# Patient Record
Sex: Female | Born: 1990 | Hispanic: No | Smoking: Never smoker
Health system: Southern US, Community
[De-identification: ages and names within clinical notes are randomized; demographics above are authoritative.]

## PROBLEM LIST (undated history)

## (undated) DIAGNOSIS — F909 Attention-deficit hyperactivity disorder, unspecified type: Secondary | ICD-10-CM

## (undated) DIAGNOSIS — F419 Anxiety disorder, unspecified: Secondary | ICD-10-CM

## (undated) DIAGNOSIS — G4700 Insomnia, unspecified: Secondary | ICD-10-CM

## (undated) DIAGNOSIS — J45909 Unspecified asthma, uncomplicated: Secondary | ICD-10-CM

## (undated) HISTORY — DX: Attention-deficit hyperactivity disorder, unspecified type: F90.9

## (undated) HISTORY — PX: DENTAL SURGERY: SHX609

## (undated) HISTORY — DX: Anxiety disorder, unspecified: F41.9

## (undated) HISTORY — DX: Insomnia, unspecified: G47.00

---

## 2018-10-15 ENCOUNTER — Emergency Department (HOSPITAL_COMMUNITY): Payer: Medicaid Other

## 2018-10-15 ENCOUNTER — Other Ambulatory Visit: Payer: Self-pay

## 2018-10-15 ENCOUNTER — Emergency Department (HOSPITAL_COMMUNITY)
Admission: EM | Admit: 2018-10-15 | Discharge: 2018-10-15 | Disposition: A | Discharge status: Home / Self Care | Payer: Medicaid Other | Attending: Emergency Medicine | Admitting: Emergency Medicine

## 2018-10-15 ENCOUNTER — Encounter (HOSPITAL_COMMUNITY): Payer: Self-pay

## 2018-10-15 DIAGNOSIS — R000 Tachycardia, unspecified: Secondary | ICD-10-CM | POA: Insufficient documentation

## 2018-10-15 DIAGNOSIS — Z209 Contact with and (suspected) exposure to unspecified communicable disease: Secondary | ICD-10-CM | POA: Diagnosis not present

## 2018-10-15 DIAGNOSIS — Z79899 Other long term (current) drug therapy: Secondary | ICD-10-CM | POA: Diagnosis not present

## 2018-10-15 DIAGNOSIS — E860 Dehydration: Secondary | ICD-10-CM | POA: Insufficient documentation

## 2018-10-15 DIAGNOSIS — J069 Acute upper respiratory infection, unspecified: Secondary | ICD-10-CM | POA: Diagnosis not present

## 2018-10-15 DIAGNOSIS — R509 Fever, unspecified: Secondary | ICD-10-CM

## 2018-10-15 LAB — CBC WITH DIFFERENTIAL/PLATELET
ABS IMMATURE GRANULOCYTES: 0.03 10*3/uL (ref 0.00–0.07)
BASOS PCT: 0 %
Basophils Absolute: 0 10*3/uL (ref 0.0–0.1)
Eosinophils Absolute: 0 10*3/uL (ref 0.0–0.5)
Eosinophils Relative: 1 %
HCT: 37.7 % (ref 36.0–46.0)
Hemoglobin: 12.5 g/dL (ref 12.0–15.0)
Immature Granulocytes: 0 %
Lymphocytes Relative: 13 %
Lymphs Abs: 1 10*3/uL (ref 0.7–4.0)
MCH: 29.4 pg (ref 26.0–34.0)
MCHC: 33.2 g/dL (ref 30.0–36.0)
MCV: 88.7 fL (ref 80.0–100.0)
MONO ABS: 0.8 10*3/uL (ref 0.1–1.0)
Monocytes Relative: 10 %
NEUTROS ABS: 6 10*3/uL (ref 1.7–7.7)
NEUTROS PCT: 76 %
PLATELETS: 223 10*3/uL (ref 150–400)
RBC: 4.25 MIL/uL (ref 3.87–5.11)
RDW: 12 % (ref 11.5–15.5)
WBC: 7.9 10*3/uL (ref 4.0–10.5)
nRBC: 0 % (ref 0.0–0.2)

## 2018-10-15 LAB — INFLUENZA PANEL BY PCR (TYPE A & B)
INFLAPCR: NEGATIVE
INFLBPCR: NEGATIVE

## 2018-10-15 LAB — COMPREHENSIVE METABOLIC PANEL
ALT: 14 U/L (ref 0–44)
ANION GAP: 11 (ref 5–15)
AST: 18 U/L (ref 15–41)
Albumin: 4 g/dL (ref 3.5–5.0)
Alkaline Phosphatase: 49 U/L (ref 38–126)
BILIRUBIN TOTAL: 0.9 mg/dL (ref 0.3–1.2)
BUN: 6 mg/dL (ref 6–20)
CO2: 25 mmol/L (ref 22–32)
Calcium: 8.6 mg/dL — ABNORMAL LOW (ref 8.9–10.3)
Chloride: 104 mmol/L (ref 98–111)
Creatinine, Ser: 0.71 mg/dL (ref 0.44–1.00)
Glucose, Bld: 106 mg/dL — ABNORMAL HIGH (ref 70–99)
POTASSIUM: 3.4 mmol/L — AB (ref 3.5–5.1)
Sodium: 140 mmol/L (ref 135–145)
TOTAL PROTEIN: 7.3 g/dL (ref 6.5–8.1)

## 2018-10-15 LAB — I-STAT BETA HCG BLOOD, ED (MC, WL, AP ONLY): I-stat hCG, quantitative: <5 m[IU]/mL (ref <5)

## 2018-10-15 LAB — URINALYSIS, ROUTINE W REFLEX MICROSCOPIC
Bilirubin Urine: NEGATIVE
GLUCOSE, UA: NEGATIVE mg/dL
HGB URINE DIPSTICK: NEGATIVE
Ketones, ur: NEGATIVE mg/dL
Leukocytes, UA: NEGATIVE
Nitrite: NEGATIVE
PH: 6 (ref 5.0–8.0)
PROTEIN: NEGATIVE mg/dL
Specific Gravity, Urine: 1.009 (ref 1.005–1.030)

## 2018-10-15 LAB — I-STAT CG4 LACTIC ACID, ED: Lactic Acid, Venous: 0.78 mmol/L (ref 0.5–1.9)

## 2018-10-15 LAB — GROUP A STREP BY PCR: GROUP A STREP BY PCR: NOT DETECTED

## 2018-10-15 MED ORDER — ACETAMINOPHEN 500 MG PO TABS
1000.0000 mg | ORAL_TABLET | Freq: Once | ORAL | Status: AC
  Administered 2018-10-15: 1000 mg via ORAL
  Filled 2018-10-15: qty 2

## 2018-10-15 MED ORDER — IBUPROFEN 800 MG PO TABS
800.0000 mg | ORAL_TABLET | Freq: Once | ORAL | Status: AC
  Administered 2018-10-15: 800 mg via ORAL
  Filled 2018-10-15: qty 1

## 2018-10-15 MED ORDER — SODIUM CHLORIDE 0.9 % IV BOLUS
1000.0000 mL | Freq: Once | INTRAVENOUS | Status: AC
  Administered 2018-10-15: 1000 mL via INTRAVENOUS

## 2018-10-15 NOTE — ED Provider Notes (Signed)
[facility] EMERGENCY DEPARTMENT Provider Note   CSN: [id number] Arrival date & time: 10/15/18  1910     History   Chief Complaint Chief Complaint  Patient presents with  . Fever    HPI Janet Morgan is a 27 y.o. female.  Pt presents to the ED today with high fever, cough, congestion.  She said she's been sick since Monday, October 28th.  She went to urgent care on 10/30 and was given rx for amox b/c of bronchitis.  She said she's not getting any better and feels terrible.  The pt said her kids were sick too, but they are now better.     History reviewed. No pertinent past medical history.  There are no active problems to display for this patient.   History reviewed. No pertinent surgical history.   OB History    Gravida  2   Para  2   Term      Preterm      AB      Living        SAB      TAB      Ectopic      Multiple      Live Births               Home Medications    Prior to Admission medications   Medication Sig Start Date End Date Taking? Authorizing Provider  amoxicillin (AMOXIL) 875 MG tablet Take 875 mg by mouth every 12 (twelve) hours. 10 day course starting on 10/13/2018 10/13/18  Yes [provider]  ibuprofen (ADVIL,MOTRIN) 800 MG tablet Take 800 mg by mouth every 8 (eight) hours as needed for mild pain or moderate pain.  07/30/18  Yes [provider]  PROAIR HFA 108 (90 Base) MCG/ACT inhaler Inhale 2 puffs into the lungs every 6 (six) hours as needed for wheezing or shortness of breath.  10/13/18  Yes [provider]  zolpidem (AMBIEN) 5 MG tablet Take 5 mg by mouth at bedtime as needed. for sleep 09/23/18  Yes [provider]    Family History No family history on file.  Social History Social History   Tobacco Use  . Smoking status: Never Smoker  . Smokeless tobacco: Never Used  Substance Use Topics  . Alcohol use: Never    Frequency: Never  . Drug use: Never     Allergies     Pepto-bismol  [bismuth subsalicylate]   Review of Systems Review of Systems  Constitutional: Positive for fever.  HENT: Positive for congestion.   Respiratory: Positive for cough.   Musculoskeletal: Positive for myalgias.  All other systems reviewed and are negative.    Physical Exam Updated Vital Signs BP 120/72 (BP Location: Left Arm)   Pulse (!) 133   Temp (!) 100.4 F (38 C) (Oral)   Resp 18   Ht 5\' 8"  (1.727 m)   Wt 78 kg   SpO2 99%   BMI 26.15 kg/m   Physical Exam  Constitutional: She is oriented to person, place, and time. She appears well-developed and well-nourished.  HENT:  Head: Normocephalic and atraumatic.  Right Ear: External ear normal.  Left Ear: External ear normal.  Nose: Nose normal.  Mouth/Throat: Mucous membranes are dry.  Eyes: Pupils are equal, round, and reactive to light. Conjunctivae and EOM are normal.  Neck: Normal range of motion. Neck supple.  Cardiovascular: Regular rhythm, normal heart sounds and intact distal pulses. Tachycardia present.  Pulmonary/Chest: Effort normal and breath  sounds normal.  Abdominal: Soft. Bowel sounds are normal.  Musculoskeletal: Normal range of motion.  Neurological: She is alert and oriented to person, place, and time.  Skin: Skin is warm. Capillary refill takes less than 2 seconds.  Psychiatric: She has a normal mood and affect. Her behavior is normal. Judgment and thought content normal.  Nursing note and vitals reviewed.    ED Treatments / Results  Labs (all labs ordered are listed, but only abnormal results are displayed) Labs Reviewed  COMPREHENSIVE METABOLIC PANEL - Abnormal; Notable for the following components:      Result Value   Potassium 3.4 (*)    Glucose, Bld 106 (*)    Calcium 8.6 (*)    All other components within normal limits  URINALYSIS, ROUTINE W REFLEX MICROSCOPIC - Abnormal; Notable for the following components:   APPearance HAZY (*)    All other components within normal  limits  GROUP A STREP BY PCR  CBC WITH DIFFERENTIAL/PLATELET  INFLUENZA PANEL BY PCR (TYPE A & B)  I-STAT CG4 LACTIC ACID, ED  I-STAT BETA HCG BLOOD, ED ([facility], [facility], [facility] ONLY)  I-STAT CG4 LACTIC ACID, ED    EKG None  Radiology Dg Chest 2 View  Result Date: 10/15/2018 CLINICAL DATA:  Productive cough. EXAM: CHEST - 2 VIEW COMPARISON:  None. FINDINGS: Cardiomediastinal silhouette is normal. Mediastinal contours appear intact. There is no evidence of focal airspace consolidation, pleural effusion or pneumothorax. Osseous structures are without acute abnormality. Soft tissues are grossly normal. IMPRESSION: No active cardiopulmonary disease. Electronically Signed   By: Ted Mcalpine M.D.   On: 10/15/2018 20:25    Procedures Procedures (including critical care time)  Medications Ordered in ED Medications  sodium chloride 0.9 % bolus 1,000 mL (0 mLs Intravenous Stopped 10/15/18 2049)  acetaminophen (TYLENOL) tablet 1,000 mg (1,000 mg Oral Given 10/15/18 1954)  ibuprofen (ADVIL,MOTRIN) tablet 800 mg (800 mg Oral Given 10/15/18 2119)     Initial Impression / Assessment and Plan / ED Course  I have reviewed the triage vital signs and the nursing notes.  Pertinent labs & imaging results that were available during my care of the patient were reviewed by me and considered in my medical decision making (see chart for details).     Fever is down.  Pt is feeling much better after 1L NS.  She knows to return if worse and to f/u with pcp.  Final Clinical Impressions(s) / ED Diagnoses   Final diagnoses:  Upper respiratory tract infection, unspecified type  Dehydration  Fever in adult    ED Discharge Orders    None       Jacalyn Lefevre, MD 10/15/18 2129

## 2018-10-15 NOTE — ED Notes (Signed)
Patient transported to X-ray 

## 2018-10-15 NOTE — ED Triage Notes (Signed)
Pt presents to ED today with complaints of fever of 103. Pt states she was seen for a sore throat and congestion 10/13/18 and treated with amoxicillin. Pt states she has still been running a high fever since Monday and alternating tylenol and motrin. Pt last had Motrin 800 mg at 1800 and Tylenol around 1500-1600. Pt states she is still having cough, congestion and sore throat.

## 2018-12-07 ENCOUNTER — Encounter (HOSPITAL_COMMUNITY): Payer: Self-pay | Admitting: Emergency Medicine

## 2018-12-07 ENCOUNTER — Emergency Department (HOSPITAL_COMMUNITY): Payer: Medicaid Other

## 2018-12-07 ENCOUNTER — Emergency Department (HOSPITAL_COMMUNITY)
Admission: EM | Admit: 2018-12-07 | Discharge: 2018-12-07 | Disposition: A | Discharge status: Home / Self Care | Payer: Medicaid Other | Attending: Emergency Medicine | Admitting: Emergency Medicine

## 2018-12-07 ENCOUNTER — Other Ambulatory Visit: Payer: Self-pay

## 2018-12-07 DIAGNOSIS — R102 Pelvic and perineal pain: Secondary | ICD-10-CM | POA: Insufficient documentation

## 2018-12-07 DIAGNOSIS — Z79899 Other long term (current) drug therapy: Secondary | ICD-10-CM | POA: Insufficient documentation

## 2018-12-07 DIAGNOSIS — J45909 Unspecified asthma, uncomplicated: Secondary | ICD-10-CM | POA: Insufficient documentation

## 2018-12-07 DIAGNOSIS — R112 Nausea with vomiting, unspecified: Secondary | ICD-10-CM | POA: Insufficient documentation

## 2018-12-07 DIAGNOSIS — R197 Diarrhea, unspecified: Secondary | ICD-10-CM | POA: Insufficient documentation

## 2018-12-07 DIAGNOSIS — R1084 Generalized abdominal pain: Secondary | ICD-10-CM

## 2018-12-07 HISTORY — DX: Unspecified asthma, uncomplicated: J45.909

## 2018-12-07 LAB — CBC WITH DIFFERENTIAL/PLATELET
Abs Immature Granulocytes: 0.02 10*3/uL (ref 0.00–0.07)
BASOS ABS: 0 10*3/uL (ref 0.0–0.1)
BASOS PCT: 0 %
EOS ABS: 0.1 10*3/uL (ref 0.0–0.5)
EOS PCT: 1 %
HCT: 41.2 % (ref 36.0–46.0)
HEMOGLOBIN: 13.2 g/dL (ref 12.0–15.0)
Immature Granulocytes: 0 %
Lymphocytes Relative: 8 %
Lymphs Abs: 0.7 10*3/uL (ref 0.7–4.0)
MCH: 28.4 pg (ref 26.0–34.0)
MCHC: 32 g/dL (ref 30.0–36.0)
MCV: 88.8 fL (ref 80.0–100.0)
MONO ABS: 0.5 10*3/uL (ref 0.1–1.0)
Monocytes Relative: 5 %
NRBC: 0 % (ref 0.0–0.2)
Neutro Abs: 7.9 10*3/uL — ABNORMAL HIGH (ref 1.7–7.7)
Neutrophils Relative %: 86 %
PLATELETS: 217 10*3/uL (ref 150–400)
RBC: 4.64 MIL/uL (ref 3.87–5.11)
RDW: 12.2 % (ref 11.5–15.5)
WBC: 9.2 10*3/uL (ref 4.0–10.5)

## 2018-12-07 LAB — COMPREHENSIVE METABOLIC PANEL
ALK PHOS: 37 U/L — AB (ref 38–126)
ALT: 11 U/L (ref 0–44)
ANION GAP: 8 (ref 5–15)
AST: 14 U/L — ABNORMAL LOW (ref 15–41)
Albumin: 4.2 g/dL (ref 3.5–5.0)
BUN: 18 mg/dL (ref 6–20)
CALCIUM: 8.7 mg/dL — AB (ref 8.9–10.3)
CO2: 25 mmol/L (ref 22–32)
CREATININE: 0.68 mg/dL (ref 0.44–1.00)
Chloride: 103 mmol/L (ref 98–111)
Glucose, Bld: 106 mg/dL — ABNORMAL HIGH (ref 70–99)
Potassium: 3.6 mmol/L (ref 3.5–5.1)
Sodium: 136 mmol/L (ref 135–145)
Total Bilirubin: 1.6 mg/dL — ABNORMAL HIGH (ref 0.3–1.2)
Total Protein: 7.4 g/dL (ref 6.5–8.1)

## 2018-12-07 LAB — URINALYSIS, ROUTINE W REFLEX MICROSCOPIC
Bacteria, UA: NONE SEEN
Bilirubin Urine: NEGATIVE
Glucose, UA: NEGATIVE mg/dL
HGB URINE DIPSTICK: NEGATIVE
Ketones, ur: NEGATIVE mg/dL
Leukocytes, UA: NEGATIVE
Nitrite: NEGATIVE
PH: 5 (ref 5.0–8.0)
Protein, ur: 30 mg/dL — AB
SPECIFIC GRAVITY, URINE: 1.028 (ref 1.005–1.030)

## 2018-12-07 LAB — LIPASE, BLOOD: LIPASE: 29 U/L (ref 11–51)

## 2018-12-07 LAB — PREGNANCY, URINE: PREG TEST UR: NEGATIVE

## 2018-12-07 MED ORDER — MORPHINE SULFATE (PF) 4 MG/ML IV SOLN
4.0000 mg | INTRAVENOUS | Status: AC | PRN
  Administered 2018-12-07 (×2): 4 mg via INTRAVENOUS
  Filled 2018-12-07 (×2): qty 1

## 2018-12-07 MED ORDER — ONDANSETRON 4 MG PO TBDP: 4.0000 mg | ORAL_TABLET | Freq: Three times a day (TID) | ORAL | 0 refills | Status: DC | PRN

## 2018-12-07 MED ORDER — ONDANSETRON HCL 4 MG/2ML IJ SOLN
4.0000 mg | INTRAMUSCULAR | Status: DC | PRN
  Administered 2018-12-07: 4 mg via INTRAVENOUS
  Filled 2018-12-07 (×3): qty 2

## 2018-12-07 MED ORDER — SODIUM CHLORIDE 0.9 % IV BOLUS
1000.0000 mL | Freq: Once | INTRAVENOUS | Status: AC
  Administered 2018-12-07: 1000 mL via INTRAVENOUS

## 2018-12-07 MED ORDER — DICYCLOMINE HCL 20 MG PO TABS: 20.0000 mg | ORAL_TABLET | Freq: Four times a day (QID) | ORAL | 0 refills | Status: DC | PRN

## 2018-12-07 MED ORDER — ONDANSETRON HCL 4 MG/2ML IJ SOLN
4.0000 mg | INTRAMUSCULAR | Status: DC | PRN
  Administered 2018-12-07: 4 mg via INTRAVENOUS

## 2018-12-07 MED ORDER — FENTANYL CITRATE (PF) 100 MCG/2ML IJ SOLN
50.0000 ug | INTRAMUSCULAR | Status: DC | PRN
  Filled 2018-12-07: qty 2

## 2018-12-07 MED ORDER — DICYCLOMINE HCL 10 MG/ML IM SOLN
20.0000 mg | Freq: Once | INTRAMUSCULAR | Status: AC
  Administered 2018-12-07: 20 mg via INTRAMUSCULAR
  Filled 2018-12-07: qty 2

## 2018-12-07 MED ORDER — IOPAMIDOL (ISOVUE-300) INJECTION 61%
100.0000 mL | Freq: Once | INTRAVENOUS | Status: AC | PRN
  Administered 2018-12-07: 100 mL via INTRAVENOUS

## 2018-12-07 NOTE — ED Triage Notes (Signed)
PT c/o RLQ abdominal pain with n/v/d, chills but denies any urinary symptoms that started around 0800 last night. PT tender with palpation on right side of abdomen.

## 2018-12-07 NOTE — Discharge Instructions (Addendum)
Take the prescriptions as directed. Take in a liquid diet for the next day, then progress to slowly to a bland diet. After that, advance to your regular diet slowly as you can tolerate it.   Avoid full strength juices, as well as milk and milk products until your diarrhea has resolved.   Call your regular medical doctor today to schedule a follow up appointment in the next 3 days.  Return to the Emergency Department immediately sooner if worsening.

## 2018-12-07 NOTE — ED Provider Notes (Signed)
[facility] EMERGENCY DEPARTMENT Provider Note   CSN: [id number] Arrival date & time: 12/07/18  0847     History   Chief Complaint Chief Complaint  Patient presents with  . Abdominal Pain    HPI Janet Morgan is a 27 y.o. female.  HPI Pt was seen at 0905.  Per pt, c/o gradual onset and persistence of constant generalized abd "pain" since yesterday evening. Pt states her abd pain started in her periumbilical area and has now moved to her RLQ.  Has been associated with multiple intermittent episodes of N/V/D.  Describes the abd pain as "aching." Describes the diarrhea as "watery." Denies fevers, no back pain, no rash, no CP/SOB, no black or blood in stools or emesis, no vaginal bleeding/discharge, no dysuria/hematuria.       Past Medical History:  Diagnosis Date  . Asthma     There are no active problems to display for this patient.   Past Surgical History:  Procedure Laterality Date  . DENTAL SURGERY       OB History    Gravida  2   Para  2   Term      Preterm      AB      Living  2     SAB  0   TAB      Ectopic      Multiple      Live Births               Home Medications    Prior to Admission medications   Medication Sig Start Date End Date Taking? Authorizing Provider  amoxicillin (AMOXIL) 875 MG tablet Take 875 mg by mouth every 12 (twelve) hours. 10 day course starting on 10/13/2018 10/13/18   [provider]  ibuprofen (ADVIL,MOTRIN) 800 MG tablet Take 800 mg by mouth every 8 (eight) hours as needed for mild pain or moderate pain.  07/30/18   [provider]  PROAIR HFA 108 (90 Base) MCG/ACT inhaler Inhale 2 puffs into the lungs every 6 (six) hours as needed for wheezing or shortness of breath.  10/13/18   [provider]  zolpidem (AMBIEN) 5 MG tablet Take 5 mg by mouth at bedtime as needed. for sleep 09/23/18   [provider]    Family History History reviewed. No pertinent family  history.  Social History Social History   Tobacco Use  . Smoking status: Never Smoker  . Smokeless tobacco: Never Used  Substance Use Topics  . Alcohol use: Yes    Frequency: Never    Comment: occ  . Drug use: Never     Allergies   Pepto-bismol  [bismuth subsalicylate]   Review of Systems Review of Systems ROS: Statement: All systems negative except as marked or noted in the HPI; Constitutional: Negative for fever and chills. ; ; Eyes: Negative for eye pain, redness and discharge. ; ; ENMT: Negative for ear pain, hoarseness, nasal congestion, sinus pressure and sore throat. ; ; Cardiovascular: Negative for chest pain, palpitations, diaphoresis, dyspnea and peripheral edema. ; ; Respiratory: Negative for cough, wheezing and stridor. ; ; Gastrointestinal: +N/V/D, abd pain. Negative for blood in stool, hematemesis, jaundice and rectal bleeding. . ; ; Genitourinary: Negative for dysuria, flank pain and hematuria. ; ; GYN:  No pelvic pain, no vaginal bleeding, no vaginal discharge, no vulvar pain. ;; Musculoskeletal: Negative for back pain and neck pain. Negative for swelling and trauma.; ; GYN:  No pelvic pain, no vaginal bleeding, no  vaginal discharge, no vulvar pain. ;; Skin: Negative for pruritus, rash, abrasions, blisters, bruising and skin lesion.; ; Neuro: Negative for headache, lightheadedness and neck stiffness. Negative for weakness, altered level of consciousness, altered mental status, extremity weakness, paresthesias, involuntary movement, seizure and syncope.       Physical Exam Updated Vital Signs BP (!) 109/93 (BP Location: Right Arm)   Pulse (!) 105   Temp 99.4 F (37.4 C) (Oral)   Resp 18   Ht 5\' 8"  (1.727 m)   Wt 74.4 kg   LMP 11/23/2018   SpO2 96%   BMI 24.94 kg/m   Physical Exam 0910: Physical examination:  Nursing notes reviewed; Vital signs and O2 SAT reviewed;  Constitutional: Well developed, Well nourished, Well hydrated, Uncomfortable appearing.; Head:   Normocephalic, atraumatic; Eyes: EOMI, PERRL, No scleral icterus; ENMT: Mouth and pharynx normal, Mucous membranes moist; Neck: Supple, Full range of motion, No lymphadenopathy; Cardiovascular: Regular rate and rhythm, No gallop; Respiratory: Breath sounds clear & equal bilaterally, No wheezes.  Speaking full sentences with ease, Normal respiratory effort/excursion; Chest: Nontender, Movement normal; Abdomen: Soft, +RLQ tenderness to palp. No rebound or guarding. Nondistended, Normal bowel sounds; Genitourinary: No CVA tenderness; Extremities: Peripheral pulses normal, No tenderness, No edema, No calf edema or asymmetry.; Neuro: AA&Ox3, Major CN grossly intact.  Speech clear. No gross focal motor or sensory deficits in extremities.; Skin: Color normal, Warm, Dry.   ED Treatments / Results  Labs (all labs ordered are listed, but only abnormal results are displayed)   EKG None  Radiology   Procedures Procedures (including critical care time)  Medications Ordered in ED Medications  ondansetron (ZOFRAN) injection 4 mg (has no administration in time range)  morphine 4 MG/ML injection 4 mg (has no administration in time range)  sodium chloride 0.9 % bolus 1,000 mL (1,000 mLs Intravenous New Bag/Given 12/07/18 0938)     Initial Impression / Assessment and Plan / ED Course  I have reviewed the triage vital signs and the nursing notes.  Pertinent labs & imaging results that were available during my care of the patient were reviewed by me and considered in my medical decision making (see chart for details).  MDM Reviewed: previous chart, nursing note and vitals Reviewed previous: labs Interpretation: labs and CT scan   Results for orders placed or performed during the hospital encounter of 12/07/18  Comprehensive metabolic panel  Result Value Ref Range   Sodium 136 135 - 145 mmol/L   Potassium 3.6 3.5 - 5.1 mmol/L   Chloride 103 98 - 111 mmol/L   CO2 25 22 - 32 mmol/L   Glucose, Bld  106 (H) 70 - 99 mg/dL   BUN 18 6 - 20 mg/dL   Creatinine, Ser 1.61 0.44 - 1.00 mg/dL   Calcium 8.7 (L) 8.9 - 10.3 mg/dL   Total Protein 7.4 6.5 - 8.1 g/dL   Albumin 4.2 3.5 - 5.0 g/dL   AST 14 (L) 15 - 41 U/L   ALT 11 0 - 44 U/L   Alkaline Phosphatase 37 (L) 38 - 126 U/L   Total Bilirubin 1.6 (H) 0.3 - 1.2 mg/dL   GFR calc non Af Amer >60 >60 mL/min   GFR calc Af Amer >60 >60 mL/min   Anion gap 8 5 - 15  Lipase, blood  Result Value Ref Range   Lipase 29 11 - 51 U/L  CBC with Differential  Result Value Ref Range   WBC 9.2 4.0 - 10.5 K/uL  RBC 4.64 3.87 - 5.11 MIL/uL   Hemoglobin 13.2 12.0 - 15.0 g/dL   HCT 16.141.2 09.636.0 - 04.546.0 %   MCV 88.8 80.0 - 100.0 fL   MCH 28.4 26.0 - 34.0 pg   MCHC 32.0 30.0 - 36.0 g/dL   RDW 40.912.2 81.111.5 - 91.415.5 %   Platelets 217 150 - 400 K/uL   nRBC 0.0 0.0 - 0.2 %   Neutrophils Relative % 86 %   Neutro Abs 7.9 (H) 1.7 - 7.7 K/uL   Lymphocytes Relative 8 %   Lymphs Abs 0.7 0.7 - 4.0 K/uL   Monocytes Relative 5 %   Monocytes Absolute 0.5 0.1 - 1.0 K/uL   Eosinophils Relative 1 %   Eosinophils Absolute 0.1 0.0 - 0.5 K/uL   Basophils Relative 0 %   Basophils Absolute 0.0 0.0 - 0.1 K/uL   Immature Granulocytes 0 %   Abs Immature Granulocytes 0.02 0.00 - 0.07 K/uL  Urinalysis, Routine w reflex microscopic  Result Value Ref Range   Color, Urine AMBER (A) YELLOW   APPearance HAZY (A) CLEAR   Specific Gravity, Urine 1.028 1.005 - 1.030   pH 5.0 5.0 - 8.0   Glucose, UA NEGATIVE NEGATIVE mg/dL   Hgb urine dipstick NEGATIVE NEGATIVE   Bilirubin Urine NEGATIVE NEGATIVE   Ketones, ur NEGATIVE NEGATIVE mg/dL   Protein, ur 30 (A) NEGATIVE mg/dL   Nitrite NEGATIVE NEGATIVE   Leukocytes, UA NEGATIVE NEGATIVE   RBC / HPF 0-5 0 - 5 RBC/hpf   Bacteria, UA NONE SEEN NONE SEEN   Squamous Epithelial / LPF 6-10 0 - 5   Mucus PRESENT   Pregnancy, urine  Result Value Ref Range   Preg Test, Ur NEGATIVE NEGATIVE   [place] Pelvis Transvanginal Non-ob (tv Only) Result  Date: 12/07/2018 CLINICAL DATA:  27 year old female with acute right lower quadrant abdominal pain, nausea vomiting and diarrhea. EXAM: TRANSABDOMINAL AND TRANSVAGINAL ULTRASOUND OF PELVIS DOPPLER ULTRASOUND OF OVARIES TECHNIQUE: Both transabdominal and transvaginal ultrasound examinations of the pelvis were performed. Transabdominal technique was performed for global imaging of the pelvis including uterus, ovaries, adnexal regions, and pelvic cul-de-sac. It was necessary to proceed with endovaginal exam following the transabdominal exam to visualize the ovaries. Color and duplex Doppler ultrasound was utilized to evaluate blood flow to the ovaries. COMPARISON:  CT Abdomen and Pelvis 1213 hours today FINDINGS: Uterus Measurements: 7.4 x 4.3 x 6.3 centimeters = volume: 103 mL. No fibroids or other mass visualized. Endometrium Thickness: 2 millimeters.  No focal abnormality visualized. Right ovary Measurements: 3.3 x 1.3 x 2.0 centimeters = volume: 4 mL. Normal appearance/no adnexal mass. Left ovary Measurements: Could not be identified despite transabdominal and transvaginal attempts. Symmetric appearance of the ovaries noted on CT earlier today. Pulsed Doppler evaluation of the visible right ovary demonstrates normal low-resistance arterial and venous waveforms. Other findings No pelvic free fluid. IMPRESSION: 1. Normal uterus and right ovary. 2. The left ovary could not be identified by ultrasound, but the ovaries had a symmetric appearance by CT earlier today. Electronically Signed   By: Odessa FlemingH  Hall M.D.   On: 12/07/2018 13:53   Ct Abdomen Pelvis W Contrast Result Date: 12/07/2018 CLINICAL DATA:  Right lower quadrant abdominal pain with nausea, vomiting, and diarrhea. EXAM: CT ABDOMEN AND PELVIS WITH CONTRAST TECHNIQUE: Multidetector CT imaging of the abdomen and pelvis was performed using the standard protocol following bolus administration of intravenous contrast. CONTRAST:  100mL ISOVUE-300 IOPAMIDOL  (ISOVUE-300) INJECTION 61% COMPARISON:  None. FINDINGS: Lower  chest: No acute abnormality. Minimal subsegmental atelectasis in both lower lobes. Hepatobiliary: Subcentimeter low-density lesion in the right hepatic lobe is too small to characterize. The gallbladder is unremarkable. No biliary dilatation. Pancreas: Unremarkable. No pancreatic ductal dilatation or surrounding inflammatory changes. Spleen: Normal in size without focal abnormality. Adrenals/Urinary Tract: The adrenal glands are unremarkable. No renal mass. 4 mm left renal calculus. No ureteral calculi or hydronephrosis. The bladder is unremarkable. Stomach/Bowel: Stomach is within normal limits. Normal appendix. No evidence of bowel wall thickening, distention, or inflammatory changes. Vascular/Lymphatic: No significant vascular findings are present. No enlarged abdominal or pelvic lymph nodes. Subcentimeter mesenteric lymph nodes are likely reactive. Reproductive: Uterus and bilateral adnexa are unremarkable. Left ovarian corpus luteum. Other: Trace free fluid in the pelvis, likely physiologic. No pneumoperitoneum. Musculoskeletal: No acute or significant osseous findings. IMPRESSION: 1.  No acute intra-abdominal process.  Normal appendix. 2. Nonobstructive left nephrolithiasis. Electronically Signed   By: Obie DredgeWilliam T Derry M.D.   On: 12/07/2018 12:40     1435:  Pt defers pelvic exam at this time. Pt has tol PO well while in the ED without N/V.  No stooling while in the ED.  Abd benign, resps easy, VSS. Feels better and wants to go home now. Tx symptomatically at this time. Dx and testing d/w pt and family.  Questions answered.  Verb understanding, agreeable to d/c home with outpt f/u.    Final Clinical Impressions(s) / ED Diagnoses   Final diagnoses:  None    ED Discharge Orders    None       Samuel JesterMcManus, Johnye Kist, DO 12/08/18 [id number]

## 2019-06-29 ENCOUNTER — Other Ambulatory Visit: Payer: Self-pay

## 2019-06-29 ENCOUNTER — Ambulatory Visit (HOSPITAL_COMMUNITY): Payer: Medicaid Other | Admitting: Licensed Clinical Social Worker

## 2019-09-26 ENCOUNTER — Encounter (HOSPITAL_COMMUNITY): Payer: Self-pay | Admitting: Emergency Medicine

## 2019-09-26 ENCOUNTER — Emergency Department (HOSPITAL_COMMUNITY)
Admission: EM | Admit: 2019-09-26 | Discharge: 2019-09-26 | Disposition: A | Discharge status: Home / Self Care | Payer: Medicaid Other | Attending: Emergency Medicine | Admitting: Emergency Medicine

## 2019-09-26 ENCOUNTER — Emergency Department (HOSPITAL_COMMUNITY): Payer: Medicaid Other

## 2019-09-26 ENCOUNTER — Other Ambulatory Visit: Payer: Self-pay

## 2019-09-26 DIAGNOSIS — J129 Viral pneumonia, unspecified: Secondary | ICD-10-CM

## 2019-09-26 DIAGNOSIS — Z20828 Contact with and (suspected) exposure to other viral communicable diseases: Secondary | ICD-10-CM | POA: Insufficient documentation

## 2019-09-26 DIAGNOSIS — R0602 Shortness of breath: Secondary | ICD-10-CM | POA: Diagnosis present

## 2019-09-26 DIAGNOSIS — J45909 Unspecified asthma, uncomplicated: Secondary | ICD-10-CM | POA: Diagnosis not present

## 2019-09-26 MED ORDER — AZITHROMYCIN 250 MG PO TABS: ORAL_TABLET | ORAL | 0 refills | Status: DC

## 2019-09-26 MED ORDER — ALBUTEROL SULFATE HFA 108 (90 BASE) MCG/ACT IN AERS
4.0000 | INHALATION_SPRAY | Freq: Once | RESPIRATORY_TRACT | Status: AC
  Administered 2019-09-26: 12:00:00 4 via RESPIRATORY_TRACT
  Filled 2019-09-26: qty 6.7

## 2019-09-26 MED ORDER — PREDNISONE 20 MG PO TABS: 40.0000 mg | ORAL_TABLET | Freq: Every day | ORAL | 0 refills | Status: DC

## 2019-09-26 MED ORDER — BENZONATATE 200 MG PO CAPS: 200.0000 mg | ORAL_CAPSULE | Freq: Three times a day (TID) | ORAL | 0 refills | Status: DC | PRN

## 2019-09-26 NOTE — Discharge Instructions (Signed)
Your chest x-ray today is concerning for a viral pneumonia.  This is sometimes seen with COVID.  You will need to self isolate at home for 10 days or at least until your COVID test results are back if they are negative.  Take the antibiotic as directed.  Continue taking Tylenol every 4 hours for fever.  Drink plenty of fluids.  Use the albuterol inhaler 1 to 2 puffs every 4-6 hours.  Take the medications as directed.  You may review your COVID test results on MyChart.  Follow-up with your primary provider for recheck, return to the ER for any worsening symptoms.

## 2019-09-26 NOTE — ED Triage Notes (Addendum)
Shortness of breath since Thursday night.  Fever since Friday.  Body aches.  [employer] positive for covid.  Pt had tylenol at 0630 today

## 2019-09-26 NOTE — ED Provider Notes (Signed)
[facility] EMERGENCY DEPARTMENT Provider Note   CSN: [id number] Arrival date & time: 09/26/19  [id number]     History   Chief Complaint Chief Complaint  Patient presents with  . Shortness of Breath    HPI Janet Morgan is a 28 y.o. female.     HPI   Janet Morgan is a 28 y.o. female with a past medical history of asthma who presents to the Emergency Department complaining of shortness of breath, cough, generalized body aches, and fever.  Symptoms have been present for days.  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  She states that she has been exposed to her.  Fever at home yesterday of 101.  She states she has been taking Tylenol every 4 hours for her fever.  She describes her cough as persistent, but nonproductive.  She states that her child tested negative last week.  She denies abdominal pain, vomiting, diarrhea, sore throat, loss of taste or smell.  Appetite has been normal   Past Medical History:  Diagnosis Date  . Asthma     There are no active problems to display for this patient.   Past Surgical History:  Procedure Laterality Date  . DENTAL SURGERY       OB History    Gravida  2   Para  2   Term      Preterm      AB      Living  2     SAB  0   TAB      Ectopic      Multiple      Live Births               Home Medications    Prior to Admission medications   Medication Sig Start Date End Date Taking? Authorizing Provider  amoxicillin (AMOXIL) 875 MG tablet Take 875 mg by mouth every 12 (twelve) hours. 10 day course starting on 10/13/2018 10/13/18   [provider]  dicyclomine (BENTYL) 20 MG tablet Take 1 tablet (20 mg total) by mouth every 6 (six) hours as needed for spasms (abdominal cramping). 12/07/18   Samuel JesterMcManus, Kathleen, DO  ibuprofen (ADVIL,MOTRIN) 800 MG tablet Take 600 mg by mouth every 8 (eight) hours as needed for mild pain or moderate pain.  07/30/18   [provider]   ondansetron (ZOFRAN ODT) 4 MG disintegrating tablet Take 1 tablet (4 mg total) by mouth every 8 (eight) hours as needed for nausea or vomiting. 12/07/18   Samuel JesterMcManus, Kathleen, DO  PROAIR HFA 108 [phone](90 Base) MCG/ACT inhaler Inhale 2 puffs into the lungs every 6 (six) hours as needed for wheezing or shortness of breath.  10/13/18   [provider]  zolpidem (AMBIEN) 5 MG tablet Take 5 mg by mouth at bedtime as needed. for sleep 09/23/18   [provider]    Family History No family history on file.  Social History Social History   Tobacco Use  . Smoking status: Never Smoker  . Smokeless tobacco: Never Used  Substance Use Topics  . Alcohol use: Yes    Frequency: Never    Comment: occ  . Drug use: Never     Allergies   Oxycodone-acetaminophen, Pepto-bismol  [bismuth subsalicylate], Codeine, and Shellfish-derived products   Review of Systems Review of Systems  Constitutional: Positive for chills and fever.  HENT: Negative for congestion, rhinorrhea, sore throat and trouble swallowing.   Respiratory: Positive for cough, chest tightness, shortness  of breath and wheezing.   Cardiovascular: Negative for chest pain.  Gastrointestinal: Negative for abdominal pain, diarrhea, nausea and vomiting.  Genitourinary: Negative for decreased urine volume, difficulty urinating and dysuria.  Musculoskeletal: Positive for myalgias. Negative for arthralgias, neck pain and neck stiffness.  Skin: Negative for rash.  Neurological: Negative for dizziness, syncope, weakness and numbness.  Hematological: Negative for adenopathy.     Physical Exam Updated Vital Signs BP 115/82 (BP Location: Right Arm)   Pulse 83   Temp 99.3 F (37.4 C) (Oral)   Resp 18   Ht 5\' 8"  (1.727 m)   Wt 76.7 kg   LMP 09/20/2019 (Exact Date)   SpO2 98%   BMI 25.70 kg/m   Physical Exam Vitals signs and nursing note reviewed.  Constitutional:      General: She is not in acute distress.    Appearance:  She is well-developed.     Comments: Patient is uncomfortable appearing, but nontoxic  HENT:     Head: Normocephalic and atraumatic.     Right Ear: Tympanic membrane and ear canal normal.     Left Ear: Tympanic membrane and ear canal normal.     Mouth/Throat:     Mouth: Mucous membranes are moist.     Pharynx: Uvula midline. No oropharyngeal exudate.  Neck:     Musculoskeletal: Full passive range of motion without pain, normal range of motion and neck supple.     Trachea: Phonation normal.  Cardiovascular:     Rate and Rhythm: Normal rate and regular rhythm.     Pulses: Normal pulses.  Pulmonary:     Effort: Pulmonary effort is normal. No respiratory distress.     Breath sounds: No stridor. Wheezing present. No rales.     Comments: Lung sounds are slightly diminished bilaterally, expiratory wheezes are present throughout.  She is able to speak in full sentences without distress Chest:     Chest wall: No tenderness.  Abdominal:     General: There is no distension.     Palpations: Abdomen is soft.     Tenderness: There is no abdominal tenderness. There is no guarding.  Musculoskeletal: Normal range of motion.  Lymphadenopathy:     Cervical: No cervical adenopathy.  Skin:    General: Skin is warm.     Capillary Refill: Capillary refill takes less than 2 seconds.     Findings: No rash.  Neurological:     General: No focal deficit present.     Mental Status: She is alert.     Sensory: No sensory deficit.     Motor: No weakness or abnormal muscle tone.      ED Treatments / Results  Labs (all labs ordered are listed, but only abnormal results are displayed) Labs Reviewed  NOVEL CORONAVIRUS, NAA (HOSP ORDER, SEND-OUT TO REF LAB; TAT 18-24 HRS)    EKG None  Radiology Dg Chest Portable 1 View  Result Date: 09/26/2019 CLINICAL DATA:  Shortness of breath and cough.  Covert exposure. EXAM: PORTABLE CHEST 1 VIEW COMPARISON:  Two-view chest x-ray 10/16/2018. FINDINGS: The  heart size is normal. Ill-defined left lower lobe airspace disease is noted. The right lung is clear. There is no edema or effusion. No focal airspace disease is present. IMPRESSION: 1. Asymmetric ill-defined airspace disease involving the left lower lobe is concerning for infection. Electronically Signed   By: 13/01/2018 M.D.   On: 09/26/2019 12:25    Procedures Procedures (including critical care time)  Medications Ordered in  ED Medications  albuterol (VENTOLIN HFA) 108 (90 Base) MCG/ACT inhaler 4 puff (4 puffs Inhalation Given 09/26/19 1217)     Initial Impression / Assessment and Plan / ED Course  I have reviewed the triage vital signs and the nursing notes.  Pertinent labs & imaging results that were available during my care of the patient were reviewed by me and considered in my medical decision making (see chart for details).        Patient with high clinical suspicion for COVID 19.  Chest x-ray shows likely viral infection.  Vital signs are reassuring.  Doubt PE or cardiac process.  No tachycardia or hypoxia  Lung sounds improved after albuterol MDI.  Dispensed for home use.  She appears appropriate for discharge home, she agrees to home isolation.  COVID test is pending.  Prescriptions written for Zithromax and steroids.  Return precautions were discussed.  Final Clinical Impressions(s) / ED Diagnoses   Final diagnoses:  Viral pneumonia    ED Discharge Orders    None       Kem Parkinson, PA-C 09/27/19 2140    Lajean Saver, MD 09/28/19 1014

## 2019-09-27 LAB — NOVEL CORONAVIRUS, NAA (HOSP ORDER, SEND-OUT TO REF LAB; TAT 18-24 HRS): SARS-CoV-2, NAA: NOT DETECTED

## 2020-06-05 ENCOUNTER — Ambulatory Visit: Payer: Medicaid Other | Attending: Nurse Practitioner | Admitting: Physical Therapy

## 2020-11-04 IMAGING — CT CT ABD-PELV W/ CM
2 of 4 series · 16 of 46 positions shown, 18 images · IV contrast (Isovue)
Comparison: None.

CLINICAL DATA: Right lower quadrant abdominal pain with nausea,
vomiting, and diarrhea.

EXAM:
CT ABDOMEN AND PELVIS WITH CONTRAST
TECHNIQUE: Multidetector CT imaging of the abdomen and pelvis was performed
using the standard protocol following bolus administration of
intravenous contrast.
CONTRAST:  100mL VSPSHY-HJJ IOPAMIDOL (VSPSHY-HJJ) INJECTION 61%

[Series 2: axial st · axial · 0.73mm/px · z∈[+837,+1287]mm · 13 of 100 slices shown, 15 images]
[im 5/100  soft-tissue]
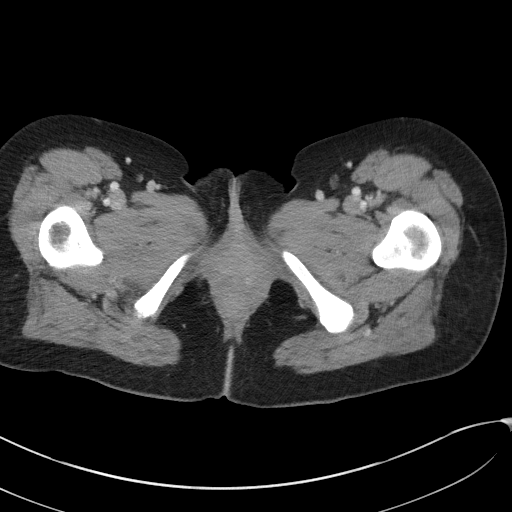
[im 5/100  bone]
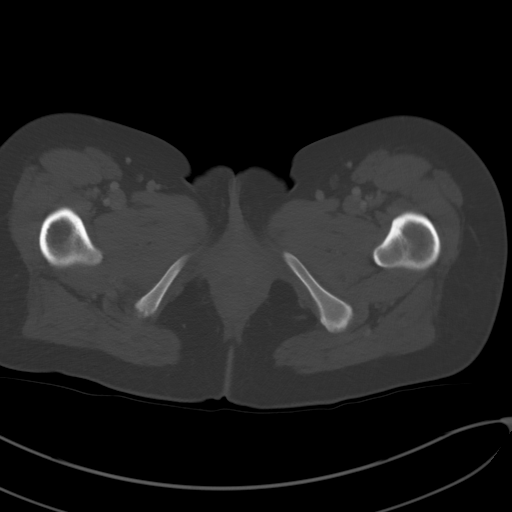
[im 13/100  soft-tissue]
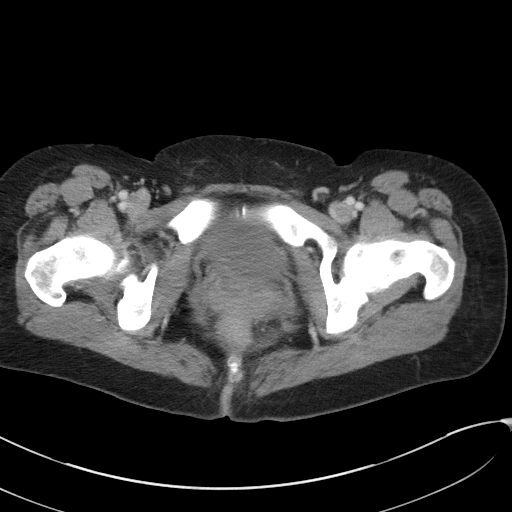
[im 22/100  soft-tissue]
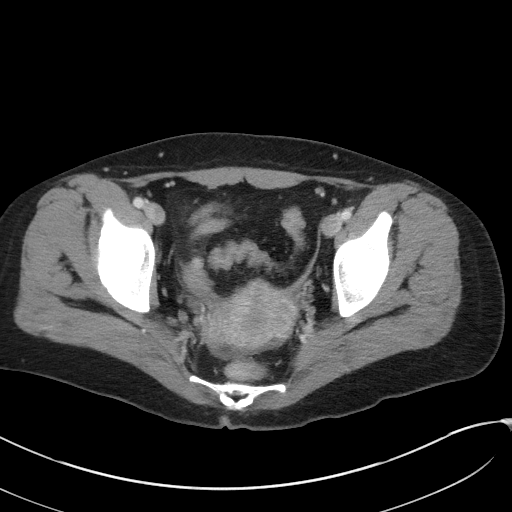
[im 26/100  soft-tissue]
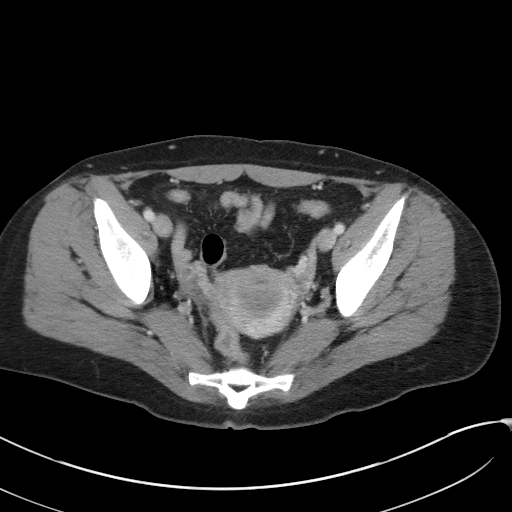
[im 35/100  soft-tissue]
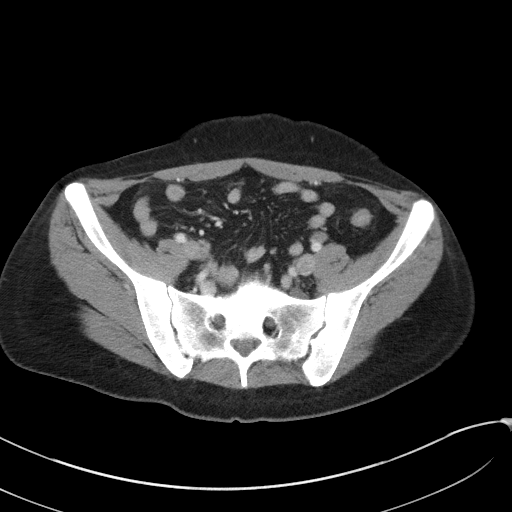
[im 44/100  soft-tissue]
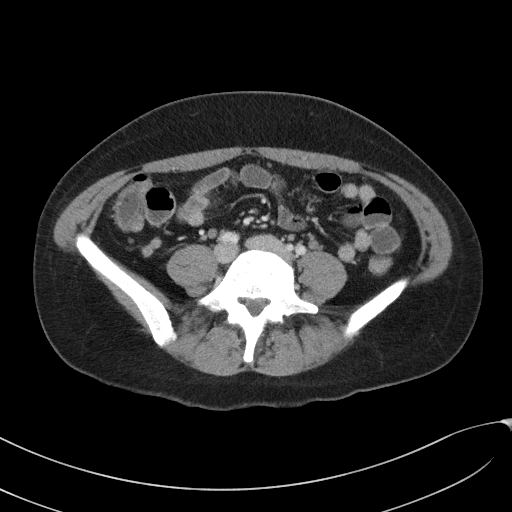
[im 52/100  soft-tissue]
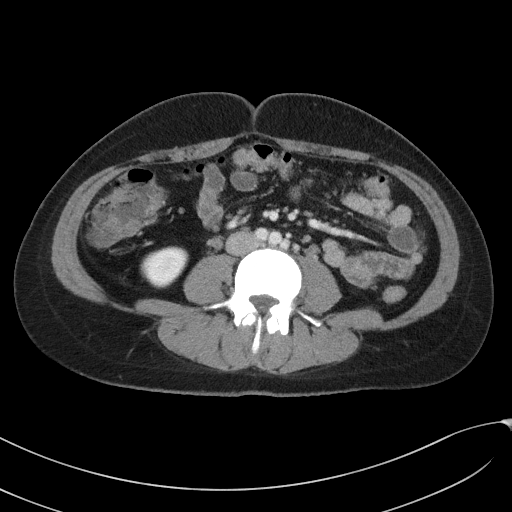
[im 56/100  soft-tissue]
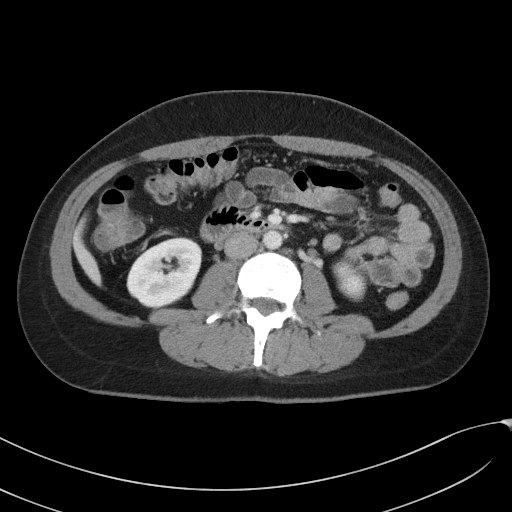
[im 65/100  soft-tissue]
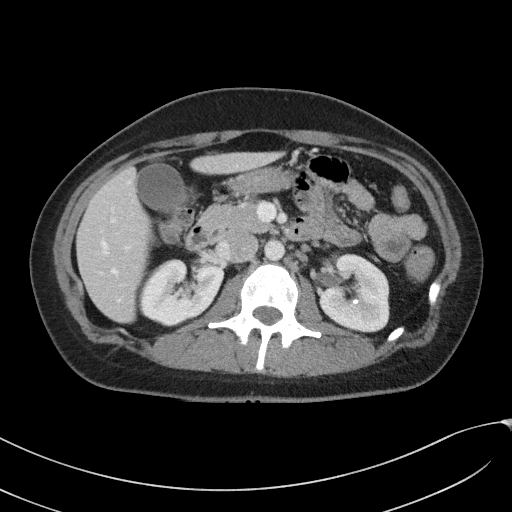
[im 65/100  bone]
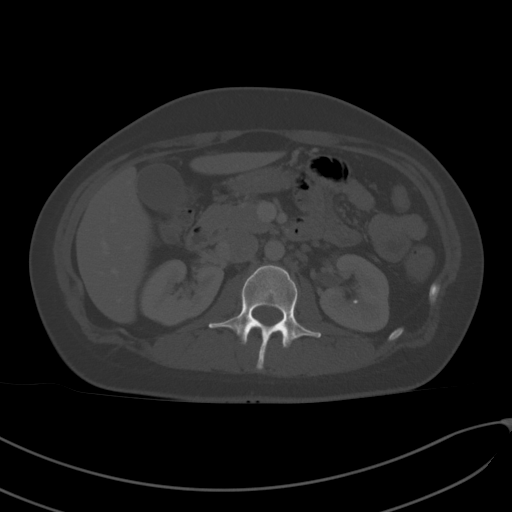
[im 74/100  soft-tissue]
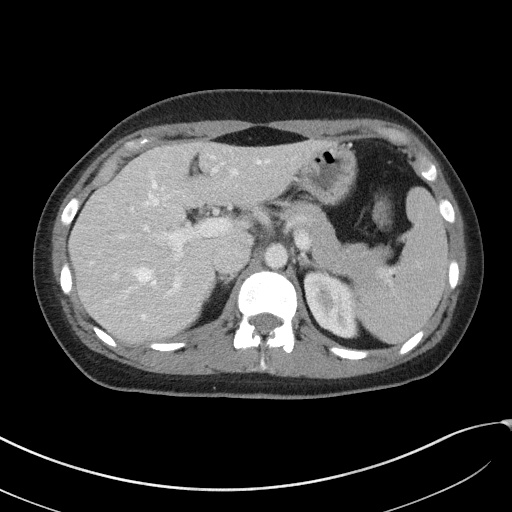
[im 78/100  soft-tissue]
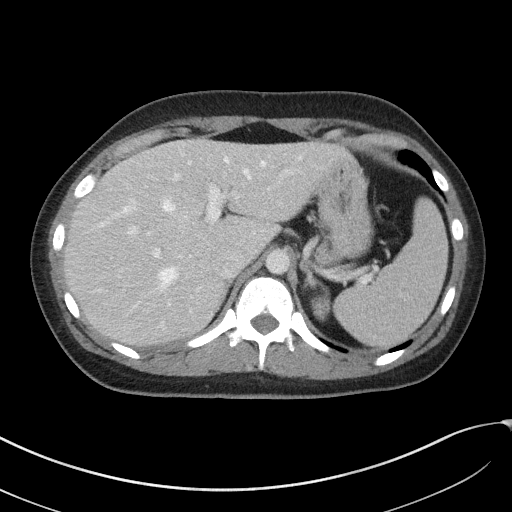
[im 87/100  soft-tissue]
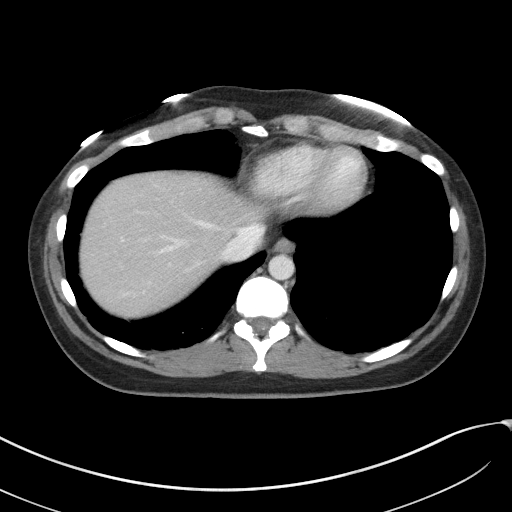
[im 95/100  soft-tissue]
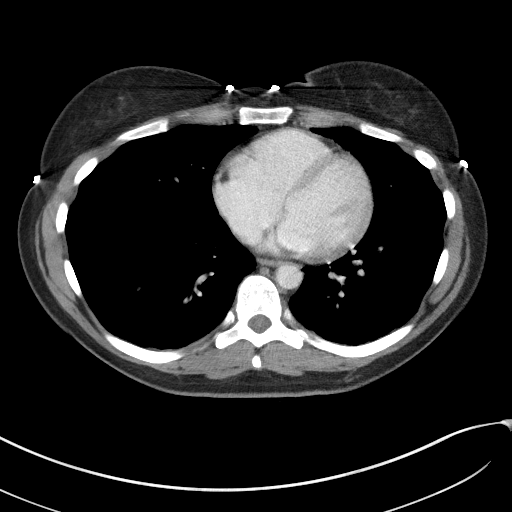

[Series 5: coronal st · coronal · 0.68mm/px · 3 of 86 slices shown]
[im 29/86  soft-tissue]
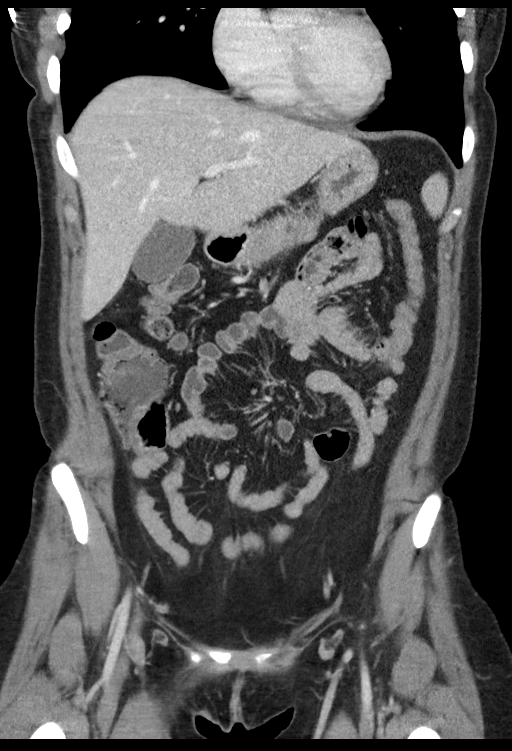
[im 38/86  soft-tissue]
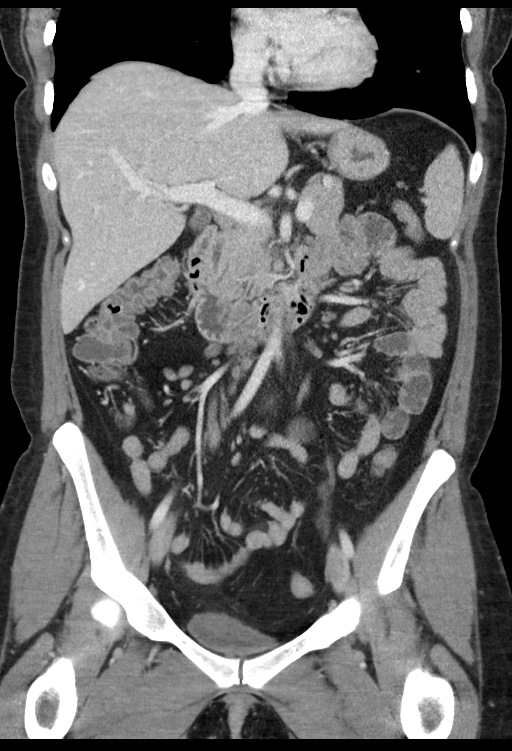
[im 48/86  soft-tissue]
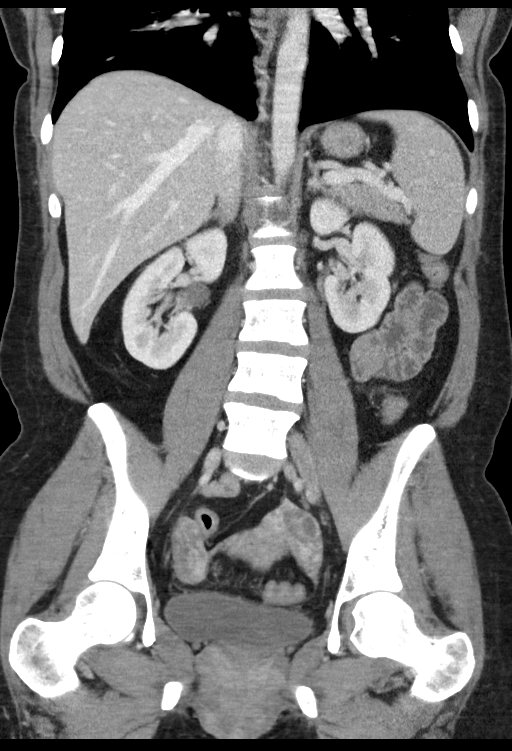

[16 of 46 positions shown; findings below may reference images not displayed]

FINDINGS: Lower chest: No acute abnormality. Minimal subsegmental atelectasis
in both lower lobes.

Hepatobiliary: Subcentimeter low-density lesion in the right hepatic
lobe is too small to characterize. The gallbladder is unremarkable.
No biliary dilatation.

Pancreas: Unremarkable. No pancreatic ductal dilatation or
surrounding inflammatory changes.

Spleen: Normal in size without focal abnormality.

Adrenals/Urinary Tract: The adrenal glands are unremarkable. No
renal mass. 4 mm left renal calculus. No ureteral calculi or
hydronephrosis. The bladder is unremarkable.

Stomach/Bowel: Stomach is within normal limits. Normal appendix. No
evidence of bowel wall thickening, distention, or inflammatory
changes.

Vascular/Lymphatic: No significant vascular findings are present. No
enlarged abdominal or pelvic lymph nodes. Subcentimeter mesenteric
lymph nodes are likely reactive.

Reproductive: Uterus and bilateral adnexa are unremarkable. Left
ovarian corpus luteum.

Other: Trace free fluid in the pelvis, likely physiologic. No
pneumoperitoneum.

Musculoskeletal: No acute or significant osseous findings.
IMPRESSION: 1.  No acute intra-abdominal process.  Normal appendix.
2. Nonobstructive left nephrolithiasis.

## 2021-01-07 ENCOUNTER — Encounter: Payer: Self-pay | Admitting: Internal Medicine

## 2021-02-16 NOTE — Progress Notes (Unsigned)
Referring Provider: Teressa Senter, NP Primary Care Physician:  Reed Breech, NP Primary Gastroenterologist:  Dr. Gala Romney  Chief Complaint  Patient presents with  . Blood In Stools    Hemorrhoids, hurts when having bm's    HPI:   Janet Morgan is a 30 y.o. female presenting today at the request of Teressa Senter, NP for rectal bleeding.  Patient saw PCP 12/12/2020 reporting hemorrhoids and she was 15 that had been worsening over the last couple of years with rectal bleeding.  Bright red blood with every bowel movement.  No prior colonoscopy.  Labs were updated and she was referred to GI.  Hemoglobin was 13.3.  Today:  States she has suffered with hemorrhoids for very long time, even when she was a teenager.  Associated intermittent rectal bleeding.  More recently, she has been having a lot of hemorrhoid swelling, sharp rectal discomfort when having BMs followed by burning and itching, and increased rectal bleeding.  Hemorrhoids initially became more problematic in 2016 after childbirth, but over the last 6 months, symptoms have been worsening.  Taking fiber supplements (fiber bars and fiber gummies), sitz baths, preparation H, witch hazel pads.   No constipation.  BMs daily and stools are soft and formed, [place] 4. No straining. No diarrhea.  No unintentional weight loss.  Rectal bleeding worsening over the last 6 months. Previously on the stool. Now blood is in the toilet water and toilet water is turning red. Bright red. No black stools. Sharp abdominal pain prior to BMs in the lower abdomen. Improves after a BM. She will lay on a heating pad to help abdominal pain. Sometimes it is hard to differentiate between cramping related to menstrual cycles. This has been going on for years without change. Used to be associated with constipation, then diarrhea, then constipation. Now her bowels are moving well, but she continues with the intermittent abdominal discomfort.   No recent blood  work. No prior colonoscopy.   Has history of GERD. Red sauces and spicy foods will cause severe symptoms. Was prescribed pantoprazole but concerned about side effects, so she never started the medication. Changed her diet. Now having symptoms a couple times a week rather than daily. Using Tums as needed. Admits to dysphagia-sensation that items go down slow or get hung in mid esophagus. Items will pass with water. No regurgitation.  Dysphagia is also been present for years.  Past Medical History:  Diagnosis Date  . ADHD   . Anxiety   . Asthma   . Insomnia     Past Surgical History:  Procedure Laterality Date  . DENTAL SURGERY      Current Outpatient Medications  Medication Sig Dispense Refill  . famotidine (PEPCID) 20 MG tablet Take 1 tablet (20 mg total) by mouth daily. 30 tablet 3  . hydrocortisone (ANUSOL-HC) 2.5 % rectal cream Place 1 application rectally 4 (four) times daily. [facility] Hemorrhoid cream with 0.125% nitroglycerine. Called to pharmacy 02/18/21.    Marland Kitchen propranolol ER (INDERAL LA) 60 MG 24 hr capsule Take 1 capsule by mouth daily.    . sertraline (ZOLOFT) 100 MG tablet Take 1.5 tablets by mouth daily.    Marland Kitchen zolpidem (AMBIEN) 5 MG tablet Take 5 mg by mouth at bedtime as needed. for sleep  0  . CLENPIQ 10-3.5-12 MG-GM -GM/160ML SOLN Take 1 kit by mouth once for 1 dose. 320 mL 0   No current facility-administered medications for this visit.    Allergies as of 02/18/2021 -  Review Complete 02/18/2021  Allergen Reaction Noted  . Oxycodone-acetaminophen Nausea And Vomiting 12/07/2018  . Pepto-bismol  [bismuth subsalicylate] Rash 24/23/5361  . Codeine Nausea And Vomiting 12/07/2018  . Shellfish-derived products Swelling 12/07/2018    Family History  Problem Relation Age of Onset  . Crohn's disease Maternal Grandmother   . Colon cancer Neg Hx   . Colon polyps Neg Hx     Social History   Socioeconomic History  . Marital status: Married    Spouse name: Not  on file  . Number of children: Not on file  . Years of education: Not on file  . Highest education level: Not on file  Occupational History  . Not on file  Tobacco Use  . Smoking status: Never Smoker  . Smokeless tobacco: Never Used  Vaping Use  . Vaping Use: Never used  Substance and Sexual Activity  . Alcohol use: Not Currently    Comment: occ  . Drug use: Never  . Sexual activity: Yes    Birth control/protection: Condom  Other Topics Concern  . Not on file  Social History Narrative  . Not on file   Social Determinants of Health   Financial Resource Strain: Not on file  Food Insecurity: Not on file  Transportation Needs: Not on file  Physical Activity: Not on file  Stress: Not on file  Social Connections: Not on file  Intimate Partner Violence: Not on file    Review of Systems: Gen: Denies any fever, chills, cold or flulike symptoms, lightheadedness, dizziness, presyncope, syncope. CV: Denies chest pain or heart palpitations. Resp: Denies shortness of breath or cough. GI: See HPI GU : Denies urinary burning, urinary frequency, urinary hesitancy MS: Denies joint pain Derm: Denies rash Psych: Denies depression or anxiety Heme: See HPI  Physical Exam: BP 105/65   Pulse 73   Temp (!) 97.3 F (36.3 C) (Temporal)   Ht 5' 8" (1.727 m)   Wt 162 lb 12.8 oz (73.8 kg)   LMP 02/06/2021   BMI 24.75 kg/m  General:   Alert and oriented. Pleasant and cooperative. Well-nourished and well-developed.  Head:  Normocephalic and atraumatic. Eyes:  Without icterus, sclera clear and conjunctiva pink.  Ears:  Normal auditory acuity. Lungs:  Clear to auscultation bilaterally. No wheezes, rales, or rhonchi. No distress.  Heart:  S1, S2 present without murmurs appreciated.  Abdomen:  +BS, soft, and non-distended.  Mild TTP in LLQ and RLQ.  No HSM noted. No guarding or rebound. No masses appreciated.  Rectal: External hemorrhoid tissue, anterior midline anal fissure, internal exam  limited by rectal discomfort.  No bright red blood on gloved exam finger. Msk:  Symmetrical without gross deformities. Normal posture. Extremities:  Without edema. Neurologic:  Alert and  oriented x4;  grossly normal neurologically. Skin:  Intact without significant lesions or rashes. Psych: Normal mood and affect.   Assessment: 30 year old female with history of asthma, anxiety, insomnia, and reported chronic history of hemorrhoids with intermittent rectal bleeding presenting today for further evaluation of rectal bleeding and rectal pain.  Also reporting chronic history of GERD as well as dysphagia.  Rectal bleeding with rectal pain: Reports chronic history of hemorrhoids dating back to her teenage years that worsened after pregnancy in 2016, previously managed with OTC hemorrhoid agents.  Over the last 6 months, symptoms have been worsening with sharp rectal pain during BMs followed by burning and itching and increased rectal bleeding with toilet water turning red.  Denies constipation, diarrhea, melena, unintentional weight  loss.  She does report chronic history of intermittent abdominal pain prior to BMs that improves thereafter without any significant change.  No family history of colon cancer or colon polyps.  Maternal grandmother has Crohn's disease.  Abdominal exam with mild TTP in LLQ and RLQ.  Rectal exam with external hemorrhoid tissue, anterior midline anal fissure, internal exam limited by rectal discomfort, no bright red blood on gloved exam finger.  Suspect rectal pain and rectal bleeding are likely secondary to hemorrhoids and anal fissure.  Less likely IBD, colon polyps, or malignancy.  Abdominal discomfort likely secondary to IBS.  She will need colonoscopy for further evaluation.  GERD with dysphagia: Chronic history of GERD currently managed with dietary adjustments and Tums as needed.  Continues with symptoms at least twice a week.  Also reporting sensation of items going down  her esophagus slowly or getting hung in her midesophagus that will pass with drinking water which is also been present for several years.    Patient likely needs to be on daily acid suppression medication.  She is very concerned about being on PPI, previously prescribed but never started.  She is willing to take H2 blocker.  Suspect dysphagia is likely secondary to esophagitis in setting of uncontrolled GERD and possibly with esophageal web, ring, or stricture.  She will need EGD for further evaluation.  Plan: 1.  Update CBC.  Patient will have labs completed with PCP.  Requested lab results to be faxed to our office. [facility] hemorrhoid cream compounded with 0.125% nitroglycerin 4 times daily x6 weeks.  Goal to continue medication for 2 weeks beyond resolution of rectal pain.  Advised patient to lay down when applying this medication. 3.  Continue fiber Gummies. 4.  Sitz bath's twice daily. 5.  Limit toilet x2-3 minutes. 6.  Avoid straining. 7.  Start famotidine 20 mg daily. 8.  Counseled on GERD diet/lifestyle.  Written instructions provided. 9.  Eat slowly, take small bites, chew thoroughly, drink plenty of liquids throughout meals.  Meats should be chopped. 10.  Proceed with colonoscopy and upper endoscopy with possible dilation with propofol in the near future with Dr. Gala Romney. The risks, benefits, and alternatives have been discussed with the patient in detail. The patient states understanding and desires to proceed.  ASA II 11.  Follow-up after procedures.  Advised to call with questions or concerns prior.   Aliene Altes, PA-C [facility] 02/18/2021

## 2021-02-18 ENCOUNTER — Other Ambulatory Visit: Payer: Self-pay

## 2021-02-18 ENCOUNTER — Encounter: Payer: Self-pay | Admitting: Gastroenterology

## 2021-02-18 ENCOUNTER — Ambulatory Visit (INDEPENDENT_AMBULATORY_CARE_PROVIDER_SITE_OTHER): Payer: Medicaid Other | Admitting: Gastroenterology

## 2021-02-18 VITALS — BP 105/65 | HR 73 | Temp 97.3°F | Ht 68.0 in | Wt 162.8 lb

## 2021-02-18 DIAGNOSIS — R1310 Dysphagia, unspecified: Secondary | ICD-10-CM | POA: Diagnosis not present

## 2021-02-18 DIAGNOSIS — K625 Hemorrhage of anus and rectum: Secondary | ICD-10-CM

## 2021-02-18 DIAGNOSIS — K219 Gastro-esophageal reflux disease without esophagitis: Secondary | ICD-10-CM | POA: Diagnosis not present

## 2021-02-18 DIAGNOSIS — K6289 Other specified diseases of anus and rectum: Secondary | ICD-10-CM | POA: Diagnosis not present

## 2021-02-18 MED ORDER — FAMOTIDINE 20 MG PO TABS: 20.0000 mg | ORAL_TABLET | Freq: Every day | ORAL | 3 refills | Status: AC

## 2021-02-18 MED ORDER — CLENPIQ 10-3.5-12 MG-GM -GM/160ML PO SOLN: 1.0000 | Freq: Once | ORAL | 0 refills | Status: AC

## 2021-02-18 NOTE — Patient Instructions (Addendum)
Please have blood work completed at your primary care's office.  We are providing you with a lab slip today. Have results faxed back to our office at [phone].  We will arrange for you to have an upper endoscopy with possible stretching of your esophagus and colonoscopy in the near future with Dr. Jena Gauss.  For rectal bleeding and rectal pain: I am calling a hemorrhoid cream compounded with nitroglycerin to [facility] to treat hemorrhoids and possible anal fissure.  You will receive a call when this medication is ready for pickup. Apply the medication 4 times a day for the next 6 weeks.  We may continue this medication longer.  To treat an anal fissure, the goal is to continue the medication for 2 weeks after rectal pain resolves. Continue fiber supplements. Sitz bath's twice daily. Limit toilet time to 2-3 minutes. Avoid straining.  Please start famotidine 20 mg every morning to help with your acid reflux symptoms.  I have sent a prescription to [facility] pharmacy in [place].  Follow a GERD diet:  Avoid fried, fatty, greasy, spicy, citrus foods. Avoid caffeine and carbonated beverages. Avoid chocolate. Try eating 4-6 small meals a day rather than 3 large meals. Do not eat within 3 hours of laying down. Prop head of bed up on wood or bricks to create a 6 inch incline.  For trouble swallowing: Eat slowly, take small bites, chew thoroughly, drink plenty of liquids throughout your meals. Meats should be chopped finely.  We will see you back after your procedure. Please call with any questions or concerns prior.   Ermalinda Memos, PA-C [facility]

## 2021-03-26 ENCOUNTER — Telehealth: Payer: Self-pay | Admitting: *Deleted

## 2021-03-26 ENCOUNTER — Other Ambulatory Visit (HOSPITAL_COMMUNITY)
Admission: RE | Admit: 2021-03-26 | Discharge: 2021-03-26 | Disposition: A | Discharge status: Home / Self Care | Payer: Medicaid Other | Source: Ambulatory Visit | Attending: Internal Medicine | Admitting: Internal Medicine

## 2021-03-26 NOTE — Progress Notes (Addendum)
Pt. Came in the Main Entrance at 1:00 today, because her children were with her, patient couldn't be tested. (Told by front staff and director.) No Children under the age of 43.  It's Spring break and she had no one she could leave them with. I called Dr. Luvenia Starch office and left a message with Mindy, explaining why patient's procedure needs to be canceled.

## 2021-03-26 NOTE — Telephone Encounter (Signed)
Received VM from [facility] with covid testing at [facility]. Was advised pt showed up with her children to have her covid test done and they had to turn her away because patient did not have anyone to watch her children while she got the covid test. Stated the kids were on spring break and didn't have a babysitter.   Called pt, LMOVM to call back

## 2021-03-27 ENCOUNTER — Encounter (HOSPITAL_COMMUNITY): Payer: Self-pay | Admitting: Anesthesiology

## 2021-03-27 NOTE — Telephone Encounter (Signed)
Called pt, LMOVM 

## 2021-03-28 ENCOUNTER — Ambulatory Visit (HOSPITAL_COMMUNITY): Admission: RE | Admit: 2021-03-28 | Payer: Medicaid Other | Source: Ambulatory Visit | Admitting: Internal Medicine

## 2021-03-28 ENCOUNTER — Encounter (HOSPITAL_COMMUNITY): Admission: RE | Payer: Self-pay | Source: Ambulatory Visit

## 2021-03-28 SURGERY — COLONOSCOPY WITH PROPOFOL
Anesthesia: Monitor Anesthesia Care

## 2021-03-28 NOTE — Telephone Encounter (Signed)
Noted. Can we call patient to get her rescheduled?

## 2021-03-28 NOTE — OR Nursing (Signed)
Patient scheduled to arrive at 0800 to have covid test and urine pregnancy done prior to procedure since she was unable to complete on Tuesday due to child care. Patient did not show up and unable to get patient on the phone. Notified Darlina Rumpf of the above. Procedures cancelled.

## 2021-03-28 NOTE — Telephone Encounter (Signed)
Noted. Will call pt later to reschedule.

## 2021-03-28 NOTE — Telephone Encounter (Signed)
Communication noted.  

## 2021-03-28 NOTE — Telephone Encounter (Signed)
Melanie at endo called office. Pt was upset because she wasn't able to do covid test for procedure today (see previous note) so Cleo agreed for pt to have rapid covid test and urine pregnancy test this morning before her procedure. Pt was a no show this morning. Endo tried to call pt and call was sent to voicemail so Cleo advised for procedure to be cancelled.  FYI to Dr. Jena Gauss and Ermalinda Memos PA.

## 2021-04-01 NOTE — Telephone Encounter (Signed)
Called pt to see if she wanted to r/s procedure and phone rang x 2 and then went to VM. LMOVM to call back

## 2021-04-02 NOTE — Telephone Encounter (Signed)
LMOVM to call back. Letter mailed. 

## 2021-08-24 IMAGING — CR DG CHEST 1V PORT
1 series · 1 of 1 positions shown · non-contrast
Comparison: Two-view chest x-ray 10/16/2018.

CLINICAL DATA: Shortness of breath and cough.  Covert exposure.

EXAM:
PORTABLE CHEST 1 VIEW

[portable]
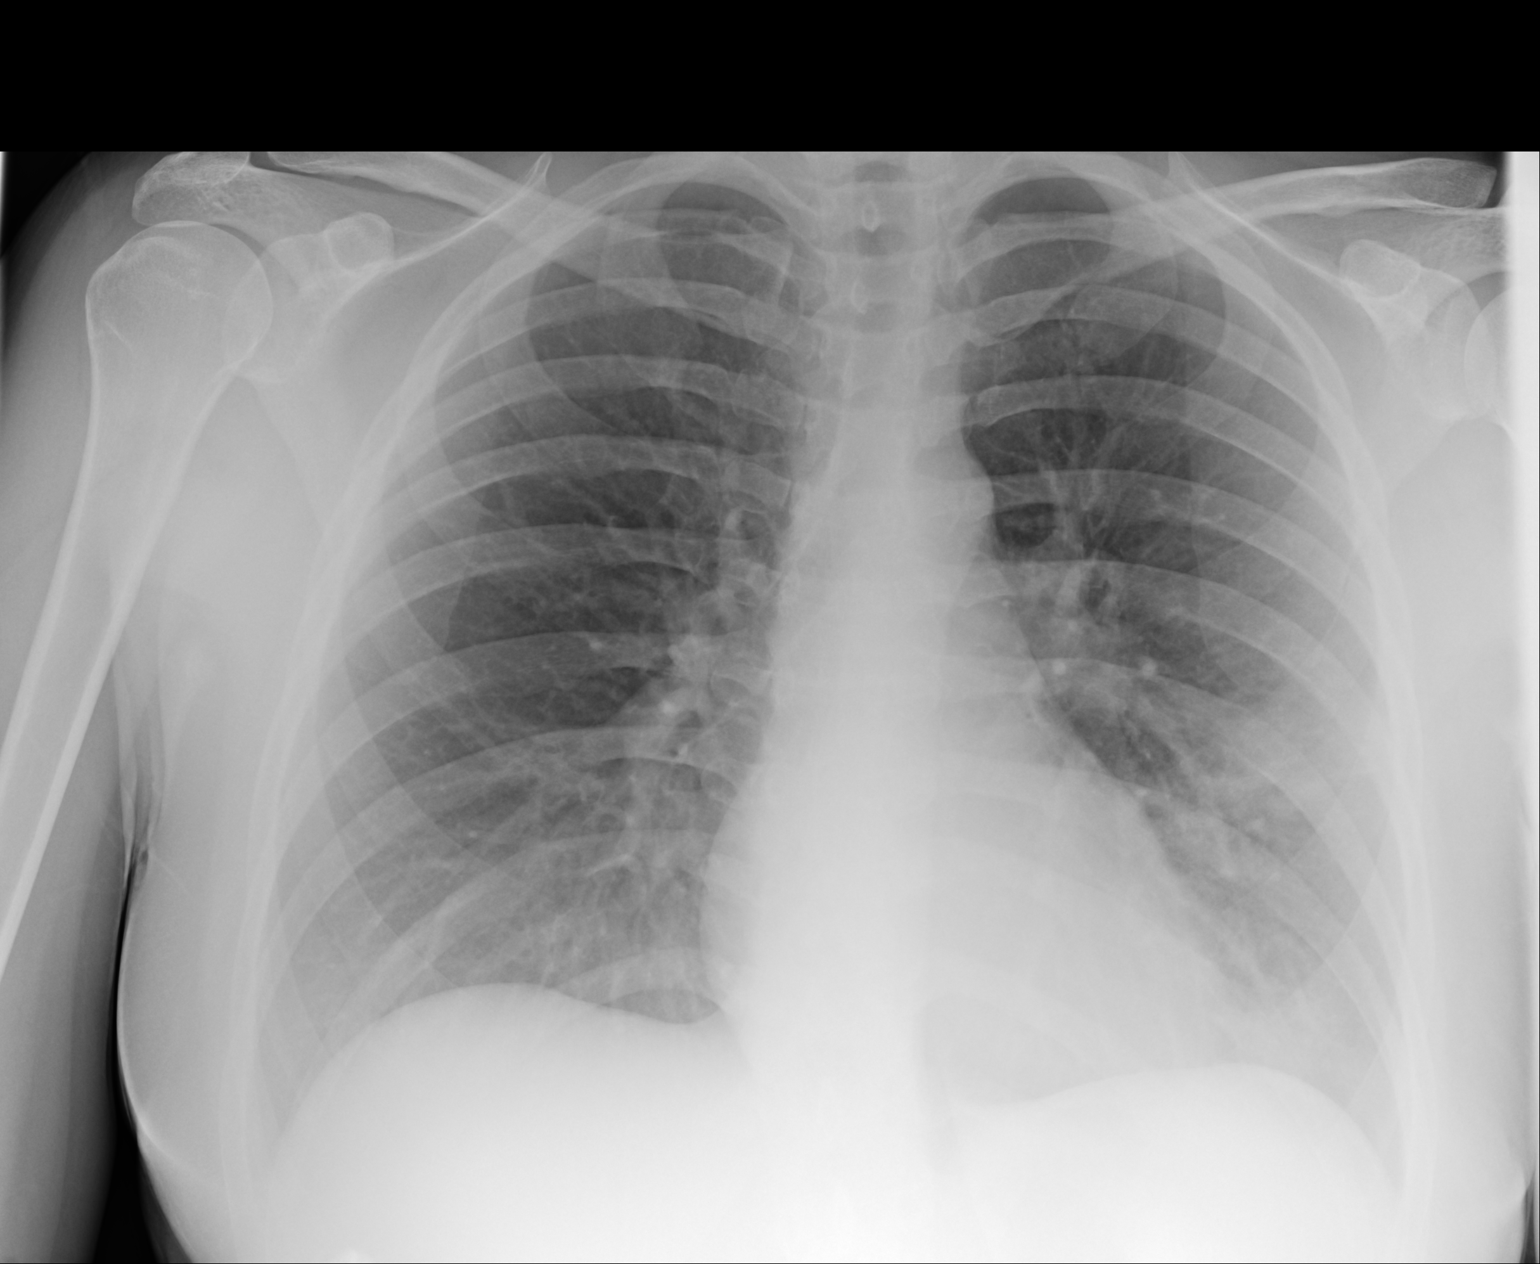

[1 of 1 positions shown; findings below may reference images not displayed]

FINDINGS: The heart size is normal. Ill-defined left lower lobe airspace
disease is noted. The right lung is clear. There is no edema or
effusion. No focal airspace disease is present.
IMPRESSION: 1. Asymmetric ill-defined airspace disease involving the left lower
lobe is concerning for infection.

## 2023-11-18 ENCOUNTER — Other Ambulatory Visit: Payer: Self-pay | Admitting: Medical Genetics

## 2024-09-23 ENCOUNTER — Encounter: Payer: Self-pay | Admitting: *Deleted

## 2024-09-23 ENCOUNTER — Other Ambulatory Visit: Payer: Self-pay | Admitting: *Deleted

## 2024-09-23 DIAGNOSIS — Z006 Encounter for examination for normal comparison and control in clinical research program: Secondary | ICD-10-CM
